# Patient Record
Sex: Male | Born: 1940
Health system: Southern US, Community
[De-identification: ages and names within clinical notes are randomized; demographics above are authoritative.]

## PROBLEM LIST (undated history)

## (undated) DIAGNOSIS — Z87448 Personal history of other diseases of urinary system: Secondary | ICD-10-CM

## (undated) DIAGNOSIS — N323 Diverticulum of bladder: Secondary | ICD-10-CM

## (undated) DIAGNOSIS — I10 Essential (primary) hypertension: Secondary | ICD-10-CM

## (undated) DIAGNOSIS — K219 Gastro-esophageal reflux disease without esophagitis: Secondary | ICD-10-CM

## (undated) DIAGNOSIS — R319 Hematuria, unspecified: Secondary | ICD-10-CM

## (undated) HISTORY — PX: APPENDECTOMY: SHX54

## (undated) HISTORY — PX: URETHROPLASTY: SHX499

## (undated) HISTORY — PX: CHOLECYSTECTOMY: SHX55

## (undated) HISTORY — PX: COLON RESECTION: SHX5231

## (undated) HISTORY — PX: SKIN FULL THICKNESS GRAFT: SHX442

## (undated) HISTORY — PX: CYSTOURETHROSCOPY: SHX476

## (undated) HISTORY — PX: COLON SURGERY: SHX602

## (undated) HISTORY — PX: HERNIA REPAIR: SHX51

---

## 2012-08-27 ENCOUNTER — Ambulatory Visit: Payer: Self-pay | Admitting: Family Medicine

## 2012-10-17 ENCOUNTER — Ambulatory Visit: Payer: Self-pay | Admitting: Urology

## 2012-10-28 ENCOUNTER — Ambulatory Visit: Payer: Self-pay | Admitting: Urology

## 2012-11-18 ENCOUNTER — Ambulatory Visit: Payer: Self-pay | Admitting: Urology

## 2012-11-25 ENCOUNTER — Ambulatory Visit: Payer: Self-pay | Admitting: Urology

## 2014-04-23 IMAGING — CT CT STONE STUDY
1 of 2 series · 15 of 32 positions shown, 19 images · non-contrast
Comparison: none

REASON FOR EXAM: hematuria
COMMENTS:

PROCEDURE:     SKEEN - SKEEN ABDOMEN/PELVIS WO ( STONE)  - August 27, 2012  [DATE]
RESULT:     Comparison: None
TECHNIQUE: Multiple axial images from the lung bases to the symphysis pubis
were obtained without oral and without intravenous contrast.

[Series 2: soft tissue · axial · 0.87mm/px · z∈[-903,-468]mm · 15 of 159 slices shown, 19 images]
[im 7/159  soft-tissue]
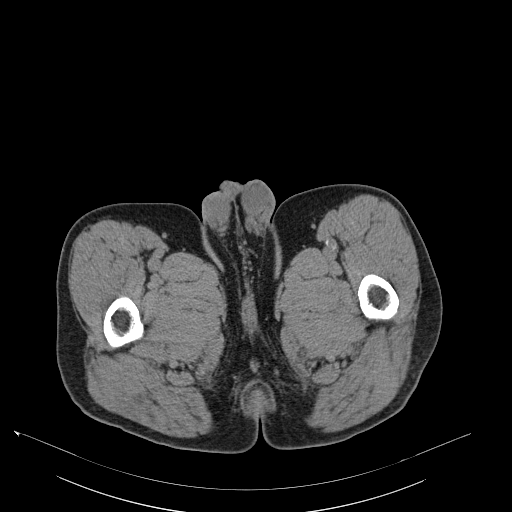
[im 7/159  bone]
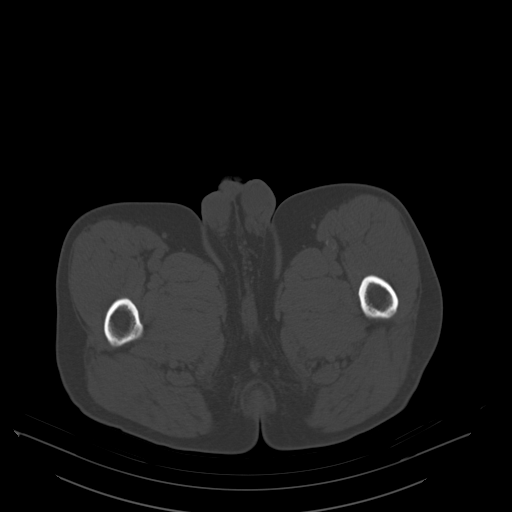
[im 20/159  soft-tissue]
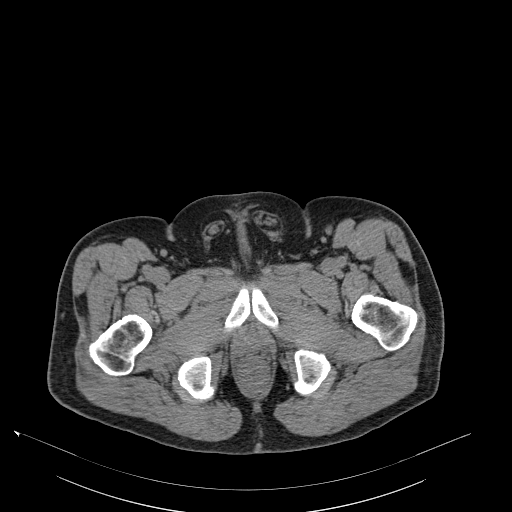
[im 33/159  soft-tissue]
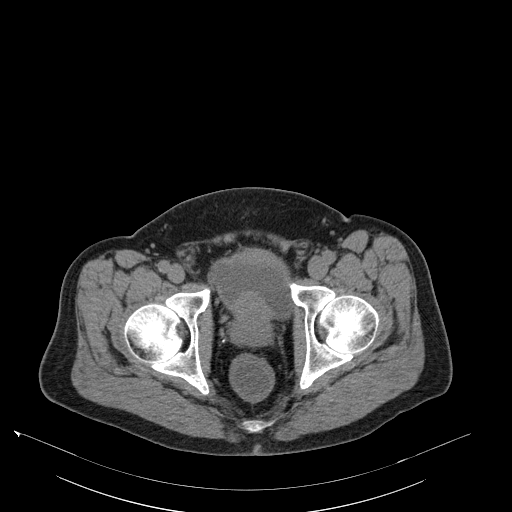
[im 47/159  soft-tissue]
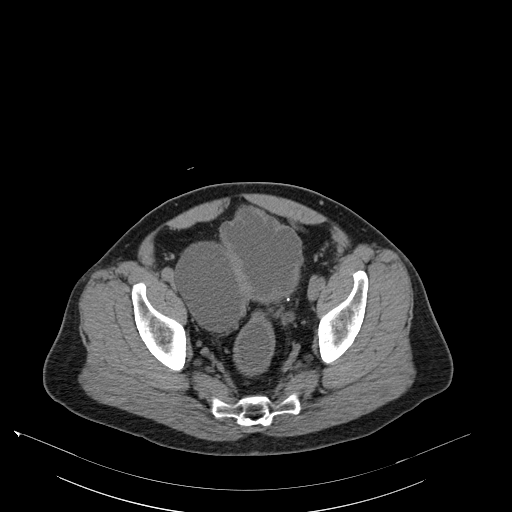
[im 53/159  soft-tissue]
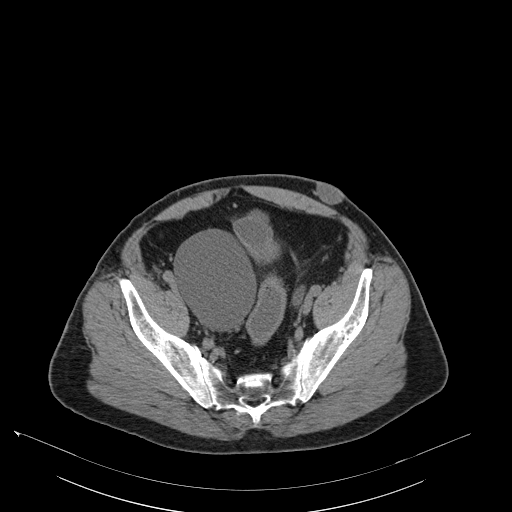
[im 66/159  soft-tissue]
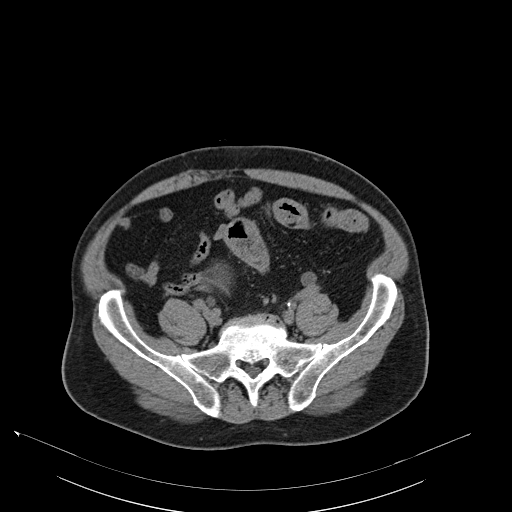
[im 80/159  soft-tissue]
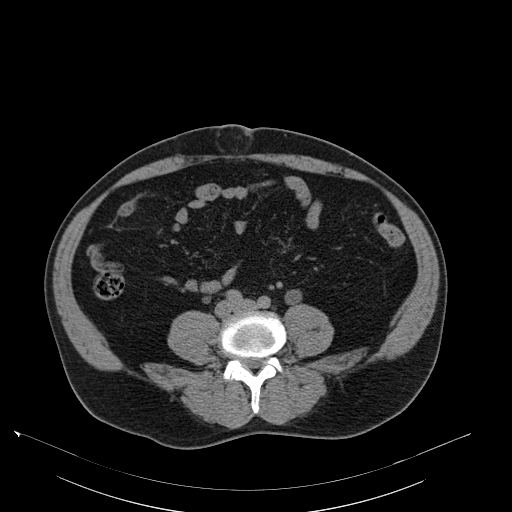
[im 93/159  soft-tissue]
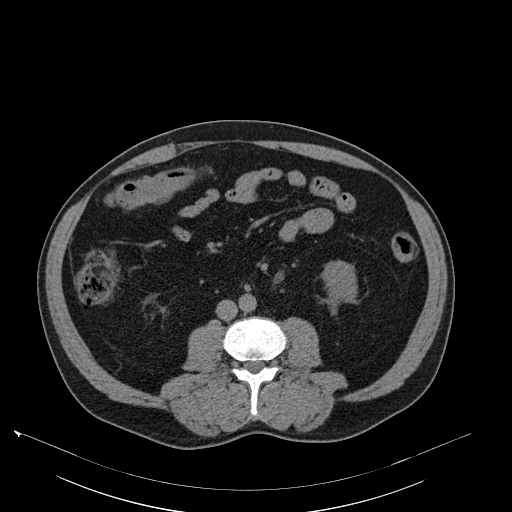
[im 106/159  soft-tissue]
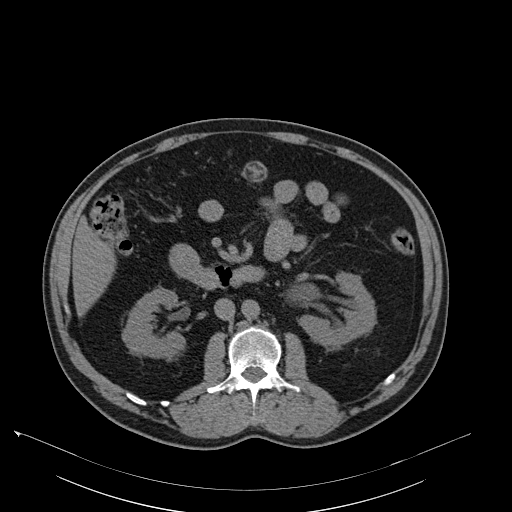
[im 106/159  bone]
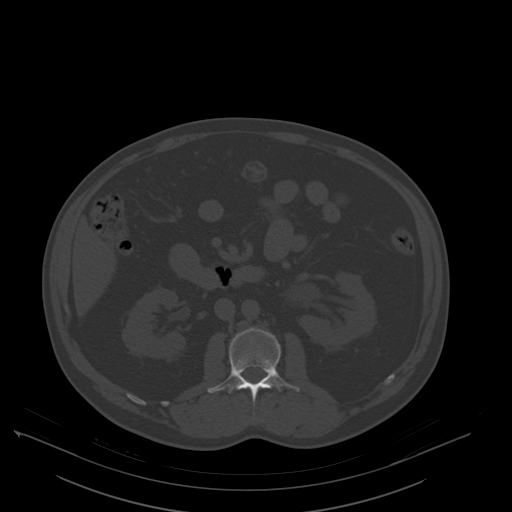
[im 112/159  soft-tissue]
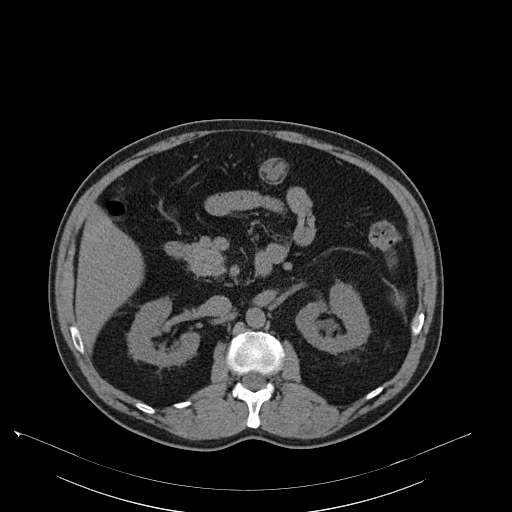
[im 126/159  soft-tissue]
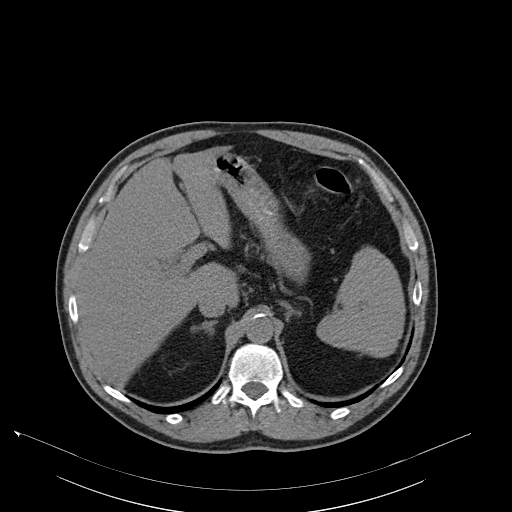
[im 132/159  lung]
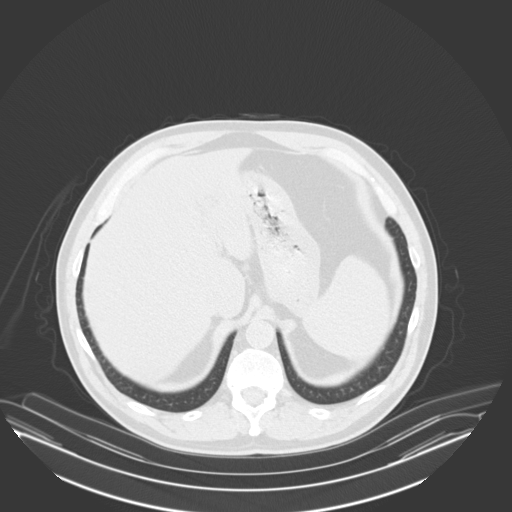
[im 139/159  soft-tissue]
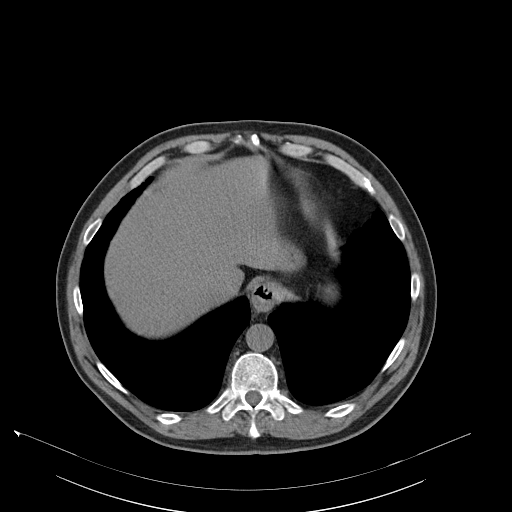
[im 139/159  lung]
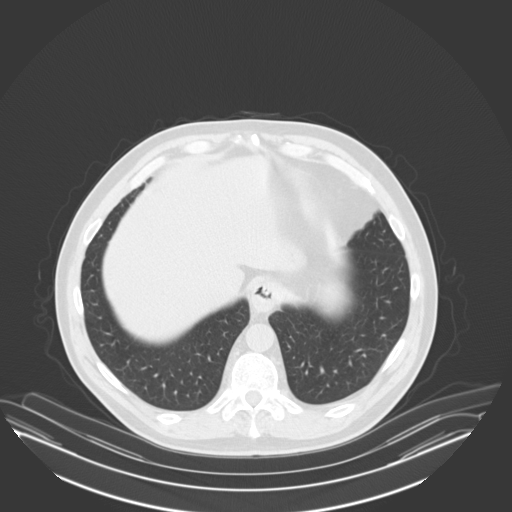
[im 145/159  lung]
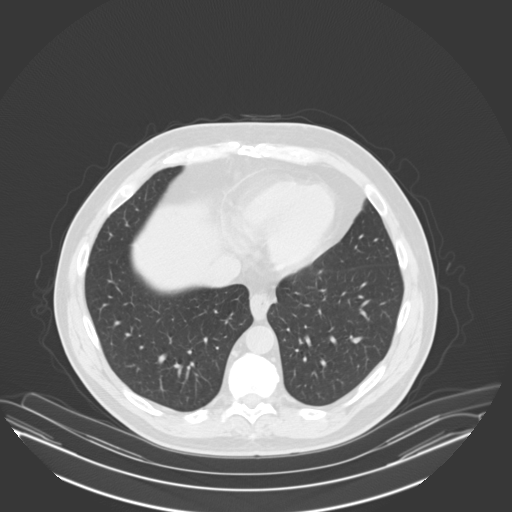
[im 152/159  soft-tissue]
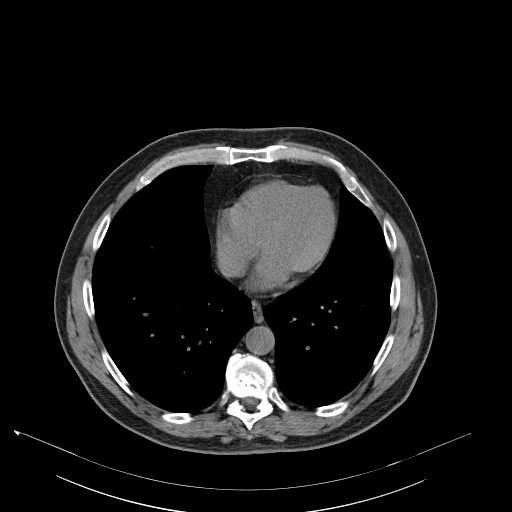
[im 152/159  lung]
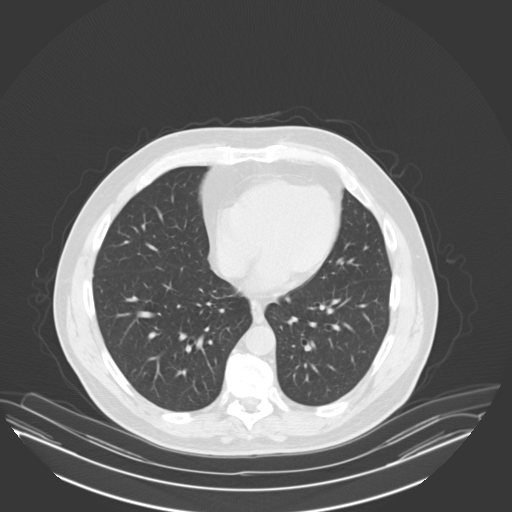

[15 of 32 positions shown; findings below may reference images not displayed]

FINDINGS: Lack of intravenous contrast limits evaluation of the solid abdominal
organs.  Grossly, the liver, spleen, adrenals, and pancreas are
unremarkable. Surgical clips are seen from prior cholecystectomy. There is a
possible small hiatal hernia.

No renal calculi identified. There is moderate dilatation of the left renal
pelvis and left ureter. This is seen to the level of the left ureterovesical
junction. There is trabeculation of the bladder which may be related to
neurogenic bladder or bladder outlet obstruction. A 2.5 cm stone is
demonstrated within the right base the bladder. There is a large low
attenuation structure along the right side of the bladder which likely
represents a large bladder diverticulum. It measures 7.0 x 4.2 cm. There is
minimal prominence of the right ureter.

The small and large bowel are normal in caliber. The appendix is not
identified, consistent with reported history of appendectomy. There is a
small fat-containing periumbilical hernia. Low-attenuation structure in the
subcutaneous fat along the anterior left wall the pelvis is nonspecific,
possibly a sebaceous cyst.

No aggressive lytic or sclerotic osseous lesions are identified.
IMPRESSION: 1. Large calculus in the bladder. Trabeculation of the bladder wall may be
related to neurogenic bladder or bladder outlet obstruction. There are
findings which likely represent a large bladder diverticulum along the right
side of the bladder. Urology consultation is recommended.
2. Moderate dilatation of the left renal pelvis and left ureter, seen to
level of the bladder without evidence of obstructing calculus. Further
evaluation could be provided with contrast-enhanced delayed images or
ureteroscopy.

## 2014-10-09 NOTE — Op Note (Signed)
PATIENT NAME:  Paul Ritter, Paul Ritter MR#:  [id number] DATE OF BIRTH:  1941/01/22  DATE OF PROCEDURE:  11/25/2012  PREOPERATIVE DIAGNOSIS: Large bladder stone with severe urethral stricture disease. No benign prostatic hypertrophy.  benign prostatic hypertrophy.  POSTOPERATIVE DIAGNOSIS: Large bladder stone with severe urethral stricture disease. No benign prostatic hypertrophy.   PROCEDURE: Suprapubic cystolithotomy.   SURGEON: Rick Duff, DO  ANESTHESIA: General.   COMPLICATIONS: None.   ESTIMATED BLOOD LOSS:  Less than 10 mL.  DESCRIPTION OF PROCEDURE: With the patient prepped and draped in the supine position, we do an incision in the midline, after appropriate time out and preop review of the case.   The incision is made through the midline down through the external fascia carefully avoiding the peritoneum. The bladder is seen.  The muscle and coverings of the bladder are incised down to the mucosa. The mucosa is opened. The bladder has been filled with normal saline irrigating solution through a transurethral Foley and so the bladder once filled is then of course emptied utilizing suction. Stone is  removed and the bladder was closed with a double layered 2-0 chromic closure and the fascia is closed with an 0 Vicryl running suture.  The skin is closed with the standard metal closure utilizing staples. Prior to this, a Foley catheter had been placed laterally through the fascia into the bladder through the suprapubic incision and then a drain also placed through the fascia above the bladder for drainage of any leakage. The transurethral Foley was connected to the bladder drainage, the suprapubic Foley is left plugged and he is sent to recovery in satisfactory condition with sterile dressings. ____________________________ Janice Coffin. Elnoria Howard, DO rdh:sb D: 11/25/2012 14:38:49 ET T: 11/25/2012 14:53:24 ET JOB#: [id number]  cc: Janice Coffin. Elnoria Howard, DO, <Dictator> Delmus Warwick D Akita Maxim DO ELECTRONICALLY  SIGNED 12/13/2012 16:12

## 2014-10-09 NOTE — Op Note (Signed)
PATIENT NAME:  Paul Ritter, Paul Ritter MR#:  [id number] DATE OF BIRTH:  06-Aug-1940  DATE OF PROCEDURE:  10/28/2012  PREOPERATIVE DIAGNOSES: Bladder stone. Severe urethral stricture.   POSTOPERATIVE DIAGNOSES: Bladder stone. Severe urethral stricture.   PROCEDURE: Cystoscopy with attempted incision of urethral stricture unsuccessful due to the stricture thickness. The complication is that I could not incise the stricture and I had, just from the hydrodilatation the urethra, got some laceration of the penile skin where it got dilated by the  back pressure from trying to open the stricture. With the patient sterilely prepped and draped in supine lithotomy position for ease approach to the external genital genitalia, I approached the stricture. It does not dilate at all. It is medium-sized stricture and I thought it would dilate easily. I put an 0.038 wire through it in an attempt to incise it with a direct vision internal urethrotomy scope and I could not incise it. The patient had some extravasation of fluid and dilatation of the skin around the urethra that caused a minor laceration. I am able to dilate some of the stricture, but I cannot get the dilatation done properly, so I have to put in a 16-French Coude catheter into the bladder, which I have no problem doing. So I do not understand why the stricture would not dilate easily to admit a scope, but I am unable to do it and I have to abandon the procedure.   PLAN: Plan is to leave the Foley in, come back and attempt to again incise the stricture or dilate the stricture with the balloon in the future to get into the bladder so I can take care of  his stone.   ____________________________ Janice Coffin. Elnoria Howard, DO rdh:aw D: 10/28/2012 15:24:12 ET T: 10/29/2012 07:42:55 ET JOB#: [id number]  cc: Janice Coffin. Elnoria Howard, DO, <Dictator> Keishon Chavarin D Ghada Abbett DO ELECTRONICALLY SIGNED 11/14/2012 17:14

## 2014-10-09 NOTE — Discharge Summary (Signed)
PATIENT NAME:  Paul Ritter, Paul Ritter MR#:  [id number] DATE OF BIRTH:  1940-07-16  DATE OF ADMISSION:  11/25/2012 DATE OF DISCHARGE:  11/26/2012   DISCHARGE DIAGNOSIS: Large vesicolithiasis with a urethral stricture.   PROCEDURE:  Open cystolithotomy on 06/09 with removal of a 3 cm thick 5 cm diameter calculus that was extremely hard consistency.   HOSPITAL COURSE:  The patient's hospital course was benign. He was able to ambulate by his first postoperative day.  He used minimal pain meds in the form of 1 dose of morphine at 6:00 p.m. on his surgery day after a noon surgery. He has some discomfort in his lower abdomen, but it is more like where the small midline suprapubic incision was made. So, we are sending the patient home.  He has at home his medications that he needs in the form of Bactrim daily once a day and pain meds in the form of oxycodone. He will need to take Milk of Magnesia for lack of bowel movements. He is sent home with a bedside bag and a leg bag; he will utilize those for drainage. He knows he has to keep his bladder decompressed for 10 days. He will be seen in the office then for a voiding trial. In the interim, he has had his Foley catheter removed and only has a suprapubic catheter which is draining beautifully. He has no outlet obstruction to speak of. His outlet obstruction was caused by his large calculus. He is discharged in satisfactory condition. The wound appears to be healing well with no evidence of an infection. His drain was removed.  Drain site will seal. Dressing can be changed p.r.n.  The patient may shower as necessary. He will be kept on his antibiotics and his pain meds, of course, until he no longer either needs his pain meds or we remove his suprapubic Foley with him voiding. Plan to do a cysto in 3 months, check on the status of his very severe bulbous urethral stricture which was dilated in the past but was too small, really, to put a scope through and too solid to put a  scope through.  ____________________________ Janice Coffin. Elnoria Howard, [place] rdh:cb D: 11/26/2012 13:29:36 ET T: 11/26/2012 14:08:49 ET JOB#: [id number]  cc: Janice Coffin. Elnoria Howard, DO, <Dictator> RICHARD D HART DO ELECTRONICALLY SIGNED 12/13/2012 16:13

## 2017-12-06 ENCOUNTER — Ambulatory Visit: Payer: Medicare Other | Admitting: Anesthesiology

## 2017-12-06 ENCOUNTER — Ambulatory Visit
Admission: RE | Admit: 2017-12-06 | Discharge: 2017-12-06 | Disposition: A | Discharge status: Home / Self Care | Payer: Medicare Other | Source: Ambulatory Visit | Attending: Gastroenterology | Admitting: Gastroenterology

## 2017-12-06 ENCOUNTER — Encounter
Admission: RE | Disposition: A | Discharge status: Home / Self Care | Payer: Self-pay | Source: Ambulatory Visit | Attending: Gastroenterology

## 2017-12-06 ENCOUNTER — Encounter: Payer: Self-pay | Admitting: *Deleted

## 2017-12-06 DIAGNOSIS — Z9089 Acquired absence of other organs: Secondary | ICD-10-CM | POA: Diagnosis not present

## 2017-12-06 DIAGNOSIS — Z79899 Other long term (current) drug therapy: Secondary | ICD-10-CM | POA: Diagnosis not present

## 2017-12-06 DIAGNOSIS — Z9049 Acquired absence of other specified parts of digestive tract: Secondary | ICD-10-CM | POA: Diagnosis not present

## 2017-12-06 DIAGNOSIS — R195 Other fecal abnormalities: Secondary | ICD-10-CM | POA: Diagnosis present

## 2017-12-06 DIAGNOSIS — Z7982 Long term (current) use of aspirin: Secondary | ICD-10-CM | POA: Diagnosis not present

## 2017-12-06 DIAGNOSIS — D122 Benign neoplasm of ascending colon: Secondary | ICD-10-CM | POA: Diagnosis not present

## 2017-12-06 DIAGNOSIS — Z980 Intestinal bypass and anastomosis status: Secondary | ICD-10-CM | POA: Insufficient documentation

## 2017-12-06 DIAGNOSIS — I10 Essential (primary) hypertension: Secondary | ICD-10-CM | POA: Insufficient documentation

## 2017-12-06 DIAGNOSIS — K219 Gastro-esophageal reflux disease without esophagitis: Secondary | ICD-10-CM | POA: Insufficient documentation

## 2017-12-06 DIAGNOSIS — Z87891 Personal history of nicotine dependence: Secondary | ICD-10-CM | POA: Diagnosis not present

## 2017-12-06 DIAGNOSIS — D120 Benign neoplasm of cecum: Secondary | ICD-10-CM | POA: Insufficient documentation

## 2017-12-06 DIAGNOSIS — Z791 Long term (current) use of non-steroidal anti-inflammatories (NSAID): Secondary | ICD-10-CM | POA: Diagnosis not present

## 2017-12-06 HISTORY — DX: Personal history of other diseases of urinary system: Z87.448

## 2017-12-06 HISTORY — DX: Gastro-esophageal reflux disease without esophagitis: K21.9

## 2017-12-06 HISTORY — DX: Diverticulum of bladder: N32.3

## 2017-12-06 HISTORY — PX: COLONOSCOPY WITH PROPOFOL: SHX5780

## 2017-12-06 HISTORY — DX: Essential (primary) hypertension: I10

## 2017-12-06 HISTORY — DX: Hematuria, unspecified: R31.9

## 2017-12-06 SURGERY — COLONOSCOPY WITH PROPOFOL
Anesthesia: General

## 2017-12-06 MED ORDER — SODIUM CHLORIDE 0.9 % IV SOLN: INTRAVENOUS | Status: DC

## 2017-12-06 MED ORDER — PROPOFOL 500 MG/50ML IV EMUL
INTRAVENOUS | Status: DC | PRN
  Administered 2017-12-06: 100 ug/kg/min via INTRAVENOUS

## 2017-12-06 MED ORDER — SODIUM CHLORIDE 0.9 % IV SOLN
INTRAVENOUS | Status: DC
  Administered 2017-12-06: 10:00:00 via INTRAVENOUS

## 2017-12-06 MED ORDER — PROPOFOL 10 MG/ML IV BOLUS
INTRAVENOUS | Status: DC | PRN
  Administered 2017-12-06: 80 mg via INTRAVENOUS

## 2017-12-06 MED ORDER — PROPOFOL 10 MG/ML IV BOLUS
INTRAVENOUS | Status: AC
  Filled 2017-12-06: qty 40

## 2017-12-06 MED ORDER — LIDOCAINE HCL (CARDIAC) PF 100 MG/5ML IV SOSY
PREFILLED_SYRINGE | INTRAVENOUS | Status: DC | PRN
  Administered 2017-12-06: 80 mg via INTRAVENOUS

## 2017-12-06 NOTE — H&P (Signed)
Outpatient short stay form Pre-procedure 12/06/2017 10:14 AM Lollie Sails MD  Primary Physician: Beatrix Fetters NP  Reason for visit: Colonoscopy  History of present illness: Patient is a 77 year old male presenting today as above.  He has a history of a positive Cologuard recently done.  Is not had any type of colonoscopy since about the mid 80s.  Tolerated his prep well.  He takes 81 mg aspirin which he is held.  He takes no other aspirin products or blood thinning agent.  Of note patient has a history of appendectomy as a child this required several surgeries as well as a reversal of apparently a colostomy.  There is a partial colectomy possible ileal colectomy.  Since that time he has had a tendency toward loose stools and may indeed have bile/choleretic diarrhea.    Current Facility-Administered Medications:  .  0.9 %  sodium chloride infusion, , Intravenous, Continuous, Lollie Sails, MD .  0.9 %  sodium chloride infusion, , Intravenous, Continuous, Lollie Sails, MD, Last Rate: 20 mL/hr at 12/06/17 0950  Medications Prior to Admission  Medication Sig Dispense Refill Last Dose  . amLODipine (NORVASC) 10 MG tablet Take 10 mg by mouth daily.   12/05/2017 at Unknown time  . aspirin EC 81 MG tablet Take 81 mg by mouth daily.   Past Week at Unknown time  . benazepril (LOTENSIN) 20 MG tablet Take 20 mg by mouth daily.   12/05/2017 at Unknown time  . finasteride (PROSCAR) 5 MG tablet Take 5 mg by mouth daily.   12/05/2017 at Unknown time  . ibuprofen (ADVIL,MOTRIN) 200 MG tablet Take 200 mg by mouth every 8 (eight) hours as needed.   Past Week at Unknown time  . omeprazole (PRILOSEC) 20 MG capsule Take 20 mg by mouth daily.   12/05/2017 at Unknown time  . tamsulosin (FLOMAX) 0.4 MG CAPS capsule Take 0.4 mg by mouth.   12/05/2017 at Unknown time     Not on File   Past Medical History:  Diagnosis Date  . Bladder diverticulum   . GERD (gastroesophageal reflux disease)   .  H/O: urethral stricture   . Hematuria   . Hypertension     Review of systems:      Physical Exam    Heart and lungs: Regular rate and rhythm without rub or gallop, lungs are bilaterally clear.    HEENT: Normocephalic atraumatic eyes are anicteric    Other:    Pertinant exam for procedure: Soft nontender nondistended bowel sounds positive normoactive    Planned proceedures: Colonoscopy and indicated procedures. I have discussed the risks benefits and complications of procedures to include not limited to bleeding, infection, perforation and the risk of sedation and the patient wishes to proceed.    Lollie Sails, MD Gastroenterology 12/06/2017  10:14 AM

## 2017-12-06 NOTE — Transfer of Care (Signed)
Immediate Anesthesia Transfer of Care Note  Patient: Paul Ritter.  Procedure(s) Performed: COLONOSCOPY WITH PROPOFOL (N/A )  Patient Location: PACU  Anesthesia Type:General  Level of Consciousness: sedated and drowsy  Airway & Oxygen Therapy: Patient Spontanous Breathing and Patient connected to nasal cannula oxygen  Post-op Assessment: Report given to RN and Post -op Vital signs reviewed and stable  Post vital signs: Reviewed and stable  Last Vitals:  Vitals Value Taken Time  BP 93/61 12/06/2017 10:47 AM  Temp 36.2 C 12/06/2017 10:47 AM  Pulse 81 12/06/2017 10:48 AM  Resp 16 12/06/2017 10:48 AM  SpO2 99 % 12/06/2017 10:48 AM  Vitals shown include unvalidated device data.  Last Pain:  Vitals:   12/06/17 1047  TempSrc: Tympanic  PainSc: 0-No pain         Complications: No apparent anesthesia complications

## 2017-12-06 NOTE — Anesthesia Post-op Follow-up Note (Signed)
Anesthesia QCDR form completed.        

## 2017-12-06 NOTE — Anesthesia Preprocedure Evaluation (Signed)
Anesthesia Evaluation  Patient identified by MRN, date of birth, ID band Patient awake    Reviewed: Allergy & Precautions, H&P , NPO status , Patient's Chart, lab work & pertinent test results  History of Anesthesia Complications Negative for: history of anesthetic complications  Airway Mallampati: III  TM Distance: <3 FB Neck ROM: limited    Dental  (+) Chipped, Missing, Poor Dentition   Pulmonary neg shortness of breath, former smoker,           Cardiovascular Exercise Tolerance: Good hypertension, (-) angina(-) Past MI and (-) DOE      Neuro/Psych negative neurological ROS  negative psych ROS   GI/Hepatic Neg liver ROS, GERD  Medicated and Controlled,  Endo/Other  negative endocrine ROS  Renal/GU negative Renal ROS  negative genitourinary   Musculoskeletal   Abdominal   Peds  Hematology negative hematology ROS (+)   Anesthesia Other Findings Past Medical History: No date: Bladder diverticulum No date: GERD (gastroesophageal reflux disease) No date: H/O: urethral stricture No date: Hematuria No date: Hypertension  Past Surgical History: No date: APPENDECTOMY No date: CHOLECYSTECTOMY No date: COLON RESECTION No date: COLON SURGERY No date: CYSTOURETHROSCOPY No date: HERNIA REPAIR No date: SKIN FULL THICKNESS GRAFT     Comment:  cheek No date: URETHROPLASTY  BMI    Body Mass Index:  26.04 kg/m      Reproductive/Obstetrics negative OB ROS                             Anesthesia Physical Anesthesia Plan  ASA: III  Anesthesia Plan: General   Post-op Pain Management:    Induction: Intravenous  PONV Risk Score and Plan: Propofol infusion and TIVA  Airway Management Planned: Natural Airway and Nasal Cannula  Additional Equipment:   Intra-op Plan:   Post-operative Plan:   Informed Consent: I have reviewed the patients History and Physical, chart, labs and  discussed the procedure including the risks, benefits and alternatives for the proposed anesthesia with the patient or authorized representative who has indicated his/her understanding and acceptance.   Dental Advisory Given  Plan Discussed with: Anesthesiologist, CRNA and Surgeon  Anesthesia Plan Comments: (Patient consented for risks of anesthesia including but not limited to:  - adverse reactions to medications - risk of intubation if required - damage to teeth, lips or other oral mucosa - sore throat or hoarseness - Damage to heart, brain, lungs or loss of life  Patient voiced understanding.)        Anesthesia Quick Evaluation

## 2017-12-06 NOTE — Op Note (Signed)
[facility] Gastroenterology Patient Name: Paul Ritter Procedure Date: 12/06/2017 10:16 AM MRN: [id number] Account #: [id number] Date of Birth: 1940-08-17 Admit Type: Outpatient Age: 77 Room: [facility] ENDO ROOM 3 Gender: Male Note Status: Finalized Procedure:            Colonoscopy Indications:          Positive Cologuard test Providers:            Lollie Sails, MD Referring MD:         Alanson Aly MD Medicines:            Monitored Anesthesia Care Complications:        No immediate complications. Procedure:            Pre-Anesthesia Assessment:                       - ASA Grade Assessment: III - A patient with severe                        systemic disease.                       After obtaining informed consent, the colonoscope was                        passed under direct vision. Throughout the procedure,                        the patient's blood pressure, pulse, and oxygen                        saturations were monitored continuously. The                        Colonoscope was introduced through the anus and                        advanced to the the terminus/ascending colon region.                        The entire colon was evaluated. The colonoscopy was                        performed without difficulty. The patient tolerated the                        procedure well. The quality of the bowel preparation                        was good. Findings:      Two sessile polyps were found in the neo-cecum. The polyps were 2 to 4       mm in size. These polyps were removed with a cold biopsy forceps.       Resection and retrieval were complete.      A 4 mm polyp was found in the ascending colon. The polyp was sessile.       The polyp was removed with a cold biopsy forceps. Resection and       retrieval were complete.      There was evidence of a prior end-to-side ileo-colonic anastomosis in       the  mid to distal ascending colon. This was patent and  was characterized       by healthy appearing mucosa.      The exam was otherwise normal throughout the examined colon.      The retroflexed view of the distal rectum and anal verge was normal and       showed no anal or rectal abnormalities.      The digital rectal exam was normal. Impression:           - Two 2 to 4 mm polyps in the cecum, removed with a                        cold biopsy forceps. Resected and retrieved.                       - One 4 mm polyp in the ascending colon, removed with a                        cold biopsy forceps. Resected and retrieved.                       - Patent end-to-side ileo-colonic anastomosis,                        characterized by healthy appearing mucosa.                       - The distal rectum and anal verge are normal on                        retroflexion view.                       -Post operative anatomy was atypical with a probable                        cecectomy, with removal of the IC valve and cecum, a                        small "dimple" at the terminus of the residual                        ascending colon and the ileo-colonic anastomosis in the                        proximal ascending colon just beyond the hepatic                        flexure. Recommendation:       - Discharge patient to home.                       - Await pathology results.                       - condiser a trial of cholestyramine in low dose                        initially for problems of diarrhea. Procedure Code(s):    --- Professional ---  58307, Colonoscopy, flexible; with biopsy, single or                        multiple CPT copyright 2017 American Medical Association. All rights reserved. The codes documented in this report are preliminary and upon coder review may  be revised to meet current compliance requirements. Lollie Sails, MD 12/06/2017 10:56:52 AM This report has been signed electronically. Number of Addenda:  0 Note Initiated On: 12/06/2017 10:16 AM Scope Withdrawal Time: 0 hours 15 minutes 12 seconds  Total Procedure Duration: 0 hours 21 minutes 14 seconds       [facility]

## 2017-12-06 NOTE — Anesthesia Postprocedure Evaluation (Signed)
Anesthesia Post Note  Patient: Paul Ritter.  Procedure(s) Performed: COLONOSCOPY WITH PROPOFOL (N/A )  Patient location during evaluation: Endoscopy Anesthesia Type: General Level of consciousness: awake and alert Pain management: pain level controlled Vital Signs Assessment: post-procedure vital signs reviewed and stable Respiratory status: spontaneous breathing, nonlabored ventilation, respiratory function stable and patient connected to nasal cannula oxygen Cardiovascular status: blood pressure returned to baseline and stable Postop Assessment: no apparent nausea or vomiting Anesthetic complications: no     Last Vitals:  Vitals:   12/06/17 0941 12/06/17 1047  BP: (!) 142/85 93/61  Pulse: 87 85  Resp: 18 16  Temp: (!) 35.7 C (!) 36.2 C  SpO2: 100% 99%    Last Pain:  Vitals:   12/06/17 1117  TempSrc:   PainSc: 0-No pain                 Precious Haws Piscitello

## 2017-12-07 LAB — SURGICAL PATHOLOGY
